# Patient Record
Sex: Male | Born: 1945 | Hispanic: Yes | Marital: Married | Smoking: Never smoker
Health system: Southern US, Community
[De-identification: ages and names within clinical notes are randomized; demographics above are authoritative.]

---

## 2011-03-07 ENCOUNTER — Other Ambulatory Visit (HOSPITAL_BASED_OUTPATIENT_CLINIC_OR_DEPARTMENT_OTHER): Payer: Self-pay | Admitting: Internal Medicine

## 2011-03-07 DIAGNOSIS — M549 Dorsalgia, unspecified: Secondary | ICD-10-CM

## 2011-03-08 ENCOUNTER — Ambulatory Visit (INDEPENDENT_AMBULATORY_CARE_PROVIDER_SITE_OTHER)
Admission: RE | Admit: 2011-03-08 | Discharge: 2011-03-08 | Disposition: A | Discharge status: Home / Self Care | Payer: Medicare Other | Source: Ambulatory Visit | Attending: Internal Medicine | Admitting: Internal Medicine

## 2011-03-08 ENCOUNTER — Ambulatory Visit (HOSPITAL_BASED_OUTPATIENT_CLINIC_OR_DEPARTMENT_OTHER)
Admission: RE | Admit: 2011-03-08 | Discharge: 2011-03-08 | Disposition: A | Discharge status: Home / Self Care | Payer: Medicare Other | Source: Ambulatory Visit | Attending: Internal Medicine | Admitting: Internal Medicine

## 2011-03-08 DIAGNOSIS — M549 Dorsalgia, unspecified: Secondary | ICD-10-CM

## 2011-03-08 DIAGNOSIS — G9389 Other specified disorders of brain: Secondary | ICD-10-CM | POA: Insufficient documentation

## 2011-03-08 MED ORDER — GADOBENATE DIMEGLUMINE 529 MG/ML IV SOLN
12.0000 mL | Freq: Once | INTRAVENOUS | Status: AC | PRN
  Administered 2011-03-08: 12 mL via INTRAVENOUS

## 2013-10-04 DEATH — deceased

## 2013-10-16 ENCOUNTER — Other Ambulatory Visit: Payer: Self-pay | Admitting: Orthopedic Surgery

## 2013-10-16 DIAGNOSIS — M545 Low back pain, unspecified: Secondary | ICD-10-CM

## 2013-10-20 ENCOUNTER — Ambulatory Visit
Admission: RE | Admit: 2013-10-20 | Discharge: 2013-10-20 | Disposition: A | Discharge status: Home / Self Care | Payer: Medicare Other | Source: Ambulatory Visit | Attending: Orthopedic Surgery | Admitting: Orthopedic Surgery

## 2013-10-20 DIAGNOSIS — M545 Low back pain: Secondary | ICD-10-CM

## 2014-02-10 ENCOUNTER — Other Ambulatory Visit: Payer: Self-pay | Admitting: Orthopaedic Surgery

## 2014-02-10 DIAGNOSIS — M549 Dorsalgia, unspecified: Secondary | ICD-10-CM

## 2014-02-12 ENCOUNTER — Ambulatory Visit
Admission: RE | Admit: 2014-02-12 | Discharge: 2014-02-12 | Disposition: A | Discharge status: Home / Self Care | Payer: Medicare Other | Source: Ambulatory Visit | Attending: Orthopaedic Surgery | Admitting: Orthopaedic Surgery

## 2014-02-12 VITALS — BP 123/74 | HR 64

## 2014-02-12 DIAGNOSIS — M549 Dorsalgia, unspecified: Secondary | ICD-10-CM

## 2014-02-12 MED ORDER — IOHEXOL 180 MG/ML  SOLN: 10.0000 mL | Freq: Once | INTRAMUSCULAR | Status: DC | PRN

## 2014-02-12 MED ORDER — IOHEXOL 300 MG/ML  SOLN
10.0000 mL | Freq: Once | INTRAMUSCULAR | Status: AC | PRN
  Administered 2014-02-12: 10 mL via INTRATHECAL

## 2014-02-12 MED ORDER — DIAZEPAM 5 MG PO TABS
5.0000 mg | ORAL_TABLET | Freq: Once | ORAL | Status: AC
  Administered 2014-02-12: 5 mg via ORAL

## 2014-02-12 NOTE — Progress Notes (Signed)
Pt states hasn't taken amitriptyline in some time. Discharge instructions explained to pt.

## 2014-02-12 NOTE — Discharge Instructions (Signed)
Myelogram Discharge Instructions  1. Go home and rest quietly for the next 24 hours.  It is important to lie flat for the next 24 hours.  Get up only to go to the restroom.  You may lie in the bed or on a couch on your back, your stomach, your left side or your right side.  You may have one pillow under your head.  You may have pillows between your knees while you are on your side or under your knees while you are on your back.  2. DO NOT drive today.  Recline the seat as far back as it will go, while still wearing your seat belt, on the way home.  3. You may get up to go to the bathroom as needed.  You may sit up for 10 minutes to eat.  You may resume your normal diet and medications unless otherwise indicated.  Drink lots of extra fluids today and tomorrow.  4. The incidence of headache, nausea, or vomiting is about 5% (one in 20 patients).  If you develop a headache, lie flat and drink plenty of fluids until the headache goes away.  Caffeinated beverages may be helpful.  If you develop severe nausea and vomiting or a headache that does not go away with flat bed rest, call [phone].  5. You may resume normal activities after your 24 hours of bed rest is over; however, do not exert yourself strongly or do any heavy lifting tomorrow. If when you get up you have a headache when standing, go back to bed and force fluids for another 24 hours.  6. Call your physician for a follow-up appointment.  The results of your myelogram will be sent directly to your physician by the following day.  7. If you have any questions or if complications develop after you arrive home, please call [phone].  Discharge instructions have been explained to the patient.  The patient, or the person responsible for the patient, fully understands these instructions.      May resume amitriptyline and tramadol on February 13, 2014, after 1:00 pm.

## 2015-11-09 IMAGING — CT CT T SPINE W/ CM
3 of 13 series · 8 of 33 positions shown, 9 images · non-contrast
Comparison: none

CLINICAL DATA: Postsurgical arthrodesis. Myelogram. Calf pain.
History of traumatic fracture of the thoracic spine status post
fixation.
TECHNIQUE: Contiguous axial images were obtained through the Thoracic and
Lumbar spine after the intrathecal infusion of infusion. Coronal and
sagittal reconstructions were obtained of the axial image sets.

[Series 2: l spine bone · axial · 0.29mm/px · z∈[-337,-172]mm · 2 of 200 slices shown, 3 images]
[im 67/200  soft-tissue]
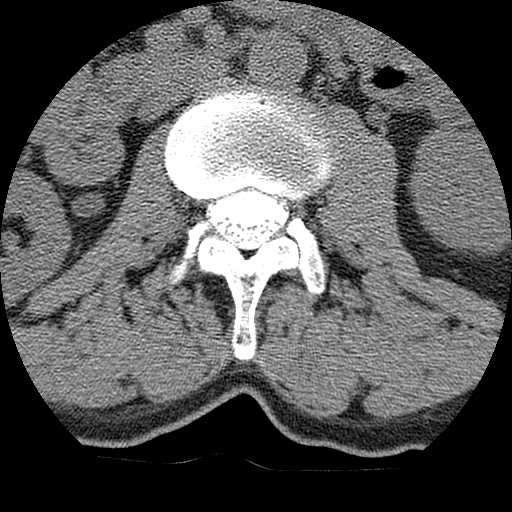
[im 67/200  bone]
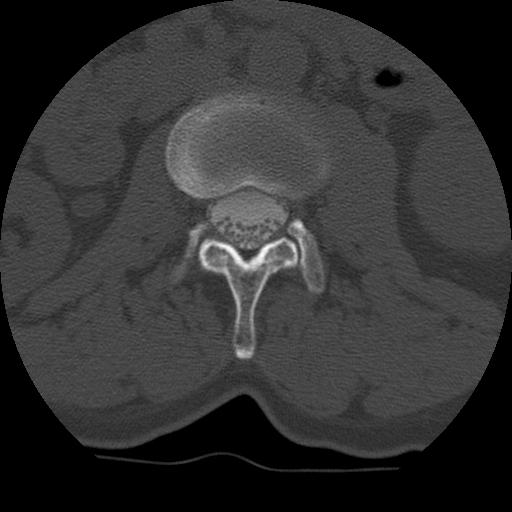
[im 133/200  bone]
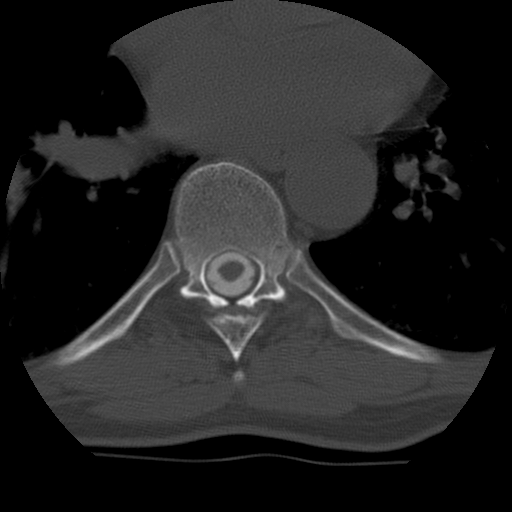

[Series 3: l spine soft · axial · 0.29mm/px · z∈[-337,-172]mm · 2 of 200 slices shown]
[im 67/200  soft-tissue]
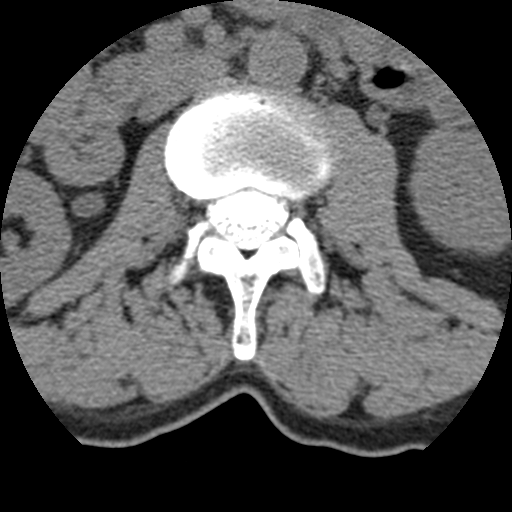
[im 133/200  soft-tissue]
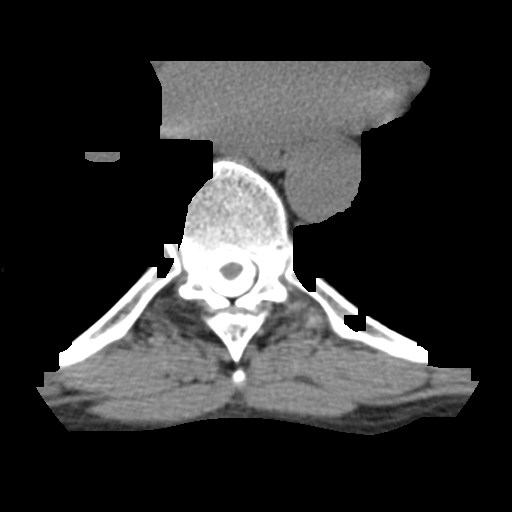

[Series 108: sag l spine · sagittal · 0.53mm/px · 4 of 53 slices shown]
[im 11/53  bone]
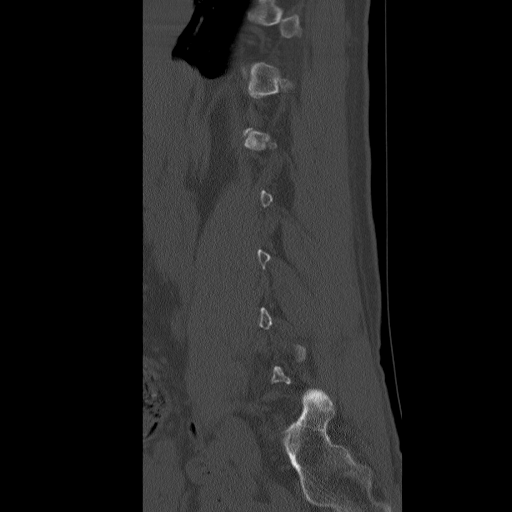
[im 21/53  bone]
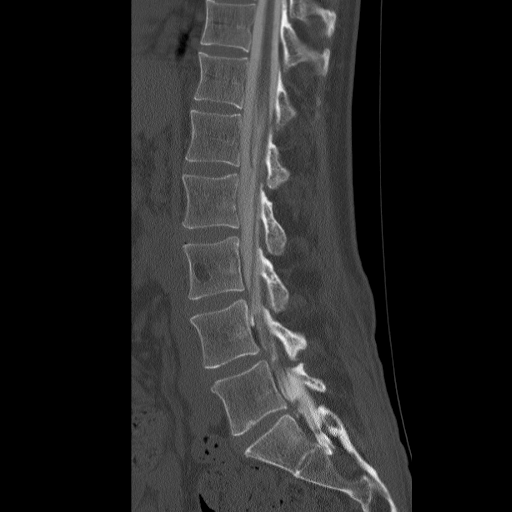
[im 32/53  bone]
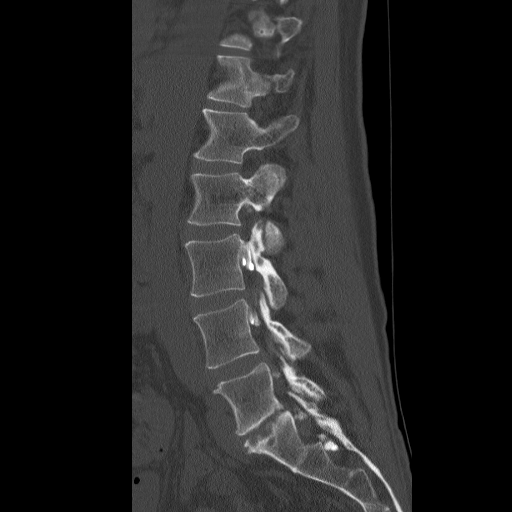
[im 42/53  bone]
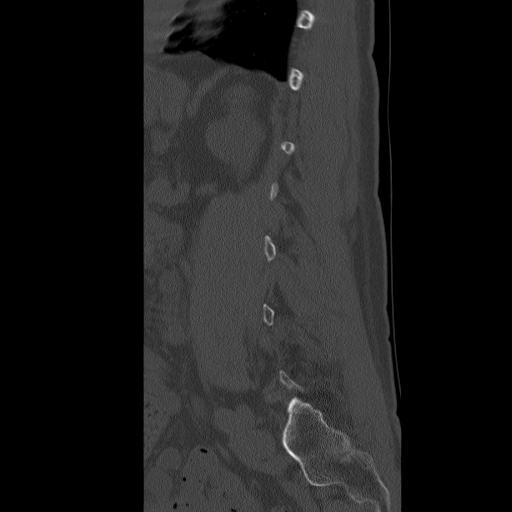

[8 of 33 positions shown; findings below may reference images not displayed]

FLUOROSCOPY TIME:  1 min 46 seconds

PROCEDURE:
LUMBAR PUNCTURE FOR THORACIC AND LUMBAR MYELOGRAM

After thorough discussion of risks and benefits of the procedure
including bleeding, infection, injury to nerves, blood vessels,
adjacent structures as well as headache and CSF leak, written and
oral informed consent was obtained. Consent was obtained by Dr.
Ivanir Uchoa.

Patient was positioned prone on the fluoroscopy table. Local
anesthesia was provided with 1% lidocaine without epinephrine after
prepped and draped in the usual sterile fashion. Puncture was
performed at L3-L4 using a 3 1/2 inch 22 gauge pencil point spinal
needle via right paramedian approach. Using a single pass through
the dura, the needle was placed within the thecal sac, with return
of clear CSF. 10 mL of [id number] was injected into the thecal
sac, with normal opacification of the nerve roots and cauda equina
consistent with free flow within the subarachnoid space. The patient
was then moved to the trendelenburg position and contrast flowed
into the Thoracic spine region.

I personally performed the lumbar puncture and administered the
intrathecal contrast. I also personally supervised acquisition of
the myelogram images.
FINDINGS: THORACIC AND LUMBAR MYELOGRAM FINDINGS:

Thoracolumbar transitional anatomy is present with L1 ribs
bilaterally. There are 12 rib-bearing thoracic vertebral bodies. The
thoracic fixation hardware may be used as a reference. No thoracic
stenosis is identified by plain film myelography. Rod and screw
fixation extends from T2 through T6. Old T4 compression fracture is
present. No other compression fractures are identified. There is no
hardware complication identified radiographically.

Lumbar spinal alignment is anatomic in the standing upright neutral
position on the frontal view. Right dynamic hip screw incidentally
noted. In the neutral upright position, alignment is anatomic. 2 mm
of anterolisthesis of L4 on L5 develops with flexion. There is no
spondylolisthesis with extension. There appears to be at L4-L5 disc
protrusion which is eccentric to the left, narrowing the left
lateral recess and potentially affecting the left L5 nerve.

CT THORACIC MYELOGRAM FINDINGS:

There is a mild concave contour of the superior T1 endplate, without
significant loss of vertebral body height. This probably represents
an old compression fracture. The T4 vertebra shows a chronic
compression fracture which is now healed. There is ossification of
the T3-T4 disc space. Rod and pedicle screw fixation is present with
screws at T2, T3, at T5 and T6 on the right and at the same levels
excepting T5 on the left. Laminectomy has been performed at T4. The
thoracic cord demonstrates normal caliber. No swelling. No cord
compression. There is bilateral T3-T4 foraminal stenosis associated
with residual posterior elements of T4 and the compression fracture.
No other levels demonstrate stenosis. Incidental visualization of
the lungs shows bilateral apical scarring which may be due to old
granulomatous disease. This appears symmetric. Partially visualized
deformity of the left chest wall from prior trauma.

Incidental imaging of abdominal contents shows nonobstructing left
renal collecting system calculi. Aortoiliac atherosclerosis. Minimal
SI joint degenerative disease. The spinal cord terminates posterior
to the L1 vertebra.

Regarding the postsurgical changes, there appears to be ankylosis of
the T5-T6 facet joints and T2-T3 facet joints bilaterally. Solid
posterior lateral fusion mass is present on the right bridging T3
and T5. No definite bridging fusion mass on the left.

At T3-T4, there is deformity of the thoracic cord, with angulation
of the cord, which probably represents a combination of thoracic
cord myelomalacia and posttraumatic adhesions/scar tissue.

CT LUMBAR MYELOGRAM FINDINGS:

Benign hemangioma is present at L3. Vertebral body height in the
lumbar spine is within normal limits. The paraspinal soft tissues
are described above. There is no spondylolisthesis in the supine
position for CT.

T12-L1:  Negative.

L1-L2:  Negative.

L2-L3:  Negative.

L3-L4:  Negative.

L4-L5: Right facet arthrosis is moderate to severe. The central
canal appears patent. The disc is mildly degenerated with
circumferential disc bulging. Nerve root sleeves fill normally. No
lateral recess stenosis is identified. The foramina appear
adequately patent.

L5-S1: Moderate left and mild right facet arthrosis. Disc
degeneration is present which is asymmetrically worse on the left.
Degenerative endplate changes. There is mild left foraminal stenosis
associated with collapse of the disc space. Lateral recesses appear
patent. Central canal patent.
IMPRESSION: 1. Technically successful lumbar puncture for myelogram using pencil
point needle at L3-L4.
2. Thoracolumbar transitional anatomy is present. There are 13 sets
of ribs with small ribs at L1. Correlation with imaging studies
recommended prior to any intervention.
3. Posterior rod and screw fixation from T2 through T6 with solid
posterior lateral fusion mass on the right. Hardware bridges T4
compression fracture which is now healed, with ossification and
ankylosis of the T3-T4 disc space. Residual T3-T4 bony foraminal
stenosis bilaterally.
4. Cord deformity dorsal the T3 is consistent with a combination of
myelomalacia and adhesions associated with prior cord trauma.
5. L4-L5 right facet arthrosis with shallow circumferential disc
bulge. On supine CT, no significant stenosis is identified. Plain
film myelogram shows mild encroachment on the descending right L5
nerve in the lateral recess, probably due to bulging disc in the
standing upright position.
6. L5-S1 asymmetric left-sided disc degeneration with mild left
foraminal stenosis.

## 2017-03-29 ENCOUNTER — Other Ambulatory Visit: Payer: Self-pay | Admitting: Pain Medicine

## 2017-03-29 DIAGNOSIS — M4714 Other spondylosis with myelopathy, thoracic region: Secondary | ICD-10-CM

## 2020-01-19 ENCOUNTER — Ambulatory Visit: Payer: Medicare Other | Attending: Internal Medicine

## 2022-08-23 ENCOUNTER — Ambulatory Visit: Payer: Medicare HMO | Admitting: Neurology

## 2022-08-23 ENCOUNTER — Encounter: Payer: Self-pay | Admitting: Neurology

## 2022-08-23 VITALS — BP 124/79 | HR 59 | Ht 67.0 in | Wt 138.5 lb

## 2022-08-23 DIAGNOSIS — R269 Unspecified abnormalities of gait and mobility: Secondary | ICD-10-CM

## 2022-08-23 DIAGNOSIS — G3184 Mild cognitive impairment, so stated: Secondary | ICD-10-CM

## 2022-08-23 NOTE — Patient Instructions (Signed)
MRI Brain without contrast  TSH and Vitamin B 12  Referral to neuropsychiatry  Follow up in a year    There are well-accepted and sensible ways to reduce risk for Alzheimers disease and other degenerative brain disorders .  Exercise Daily Walk A daily 20 minute walk should be part of your routine. Disease related apathy can be a significant roadblock to exercise and the only way to overcome this is to make it a daily routine and perhaps have a reward at the end (something your loved one loves to eat or drink perhaps) or a personal trainer coming to the home can also be very useful. Most importantly, the patient is much more likely to exercise if the caregiver / spouse does it with him/her. In general a structured, repetitive schedule is best.  General Health: Any diseases which effect your body will effect your brain such as a pneumonia, urinary infection, blood clot, heart attack or stroke. Keep contact with your primary care doctor for regular follow ups.  Sleep. A good nights sleep is healthy for the brain. Seven hours is recommended. If you have insomnia or poor sleep habits we can give you some instructions. If you have sleep apnea wear your mask.  Diet: Eating a heart healthy diet is also a good idea; fish and poultry instead of red meat, nuts (mostly non-peanuts), vegetables, fruits, olive oil or canola oil (instead of butter), minimal salt (use other spices to flavor foods), whole grain rice, bread, cereal and pasta and wine in moderation.Research is now showing that the MIND diet, which is a combination of The Mediterranean diet and the DASH diet, is beneficial for cognitive processing and longevity. Information about this diet can be found in The MIND Diet, a book by Doyne Keel, MS, RDN, and online at [employer].si  Finances, Power of Attorney and Advance Directives: You should consider putting legal safeguards in place with regard to financial and medical  decision making. While the spouse always has power of attorney for medical and financial issues in the absence of any form, you should consider what you want in case the spouse / caregiver is no longer around or capable of making decisions.
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
## 2022-08-24 ENCOUNTER — Telehealth: Payer: Self-pay

## 2022-08-24 ENCOUNTER — Telehealth: Payer: Self-pay | Admitting: Neurology

## 2022-08-24 LAB — VITAMIN B12: Vitamin B-12: 794 pg/mL (ref 232–1245)

## 2022-08-24 LAB — TSH: TSH: 3.99 u[IU]/mL (ref 0.450–4.500)

## 2022-08-24 NOTE — Telephone Encounter (Signed)
I spoke with the patient and provided normal results. He verbalized understanding and expressed appreciation for the call.

## 2022-08-24 NOTE — Telephone Encounter (Signed)
[employer] medicare sent to [facility] they obtain auth  

## 2022-08-24 NOTE — Progress Notes (Signed)
Please call and advise the patient that the recent TSH and Vitamin B12 level we checked were within normal limits. No further action is required on these tests at this time. Please remind patient to keep any upcoming appointments or tests and to call us with any interim questions, concerns, problems or updates. Thanks,   Jaquasia Doscher, MD    

## 2022-08-24 NOTE — Telephone Encounter (Signed)
-----   Message from Alric Ran, MD sent at 08/24/2022  8:16 AM EDT ----- Please call and advise the patient that the recent TSH and Vitamin B12 level we checked were within normal limits. No further action is required on these tests at this time. Please remind patient to keep any upcoming appointments or tests and to call us with any interim questions, concerns, problems or updates. Thanks,   Alric Ran, MD

## 2022-08-29 ENCOUNTER — Telehealth: Payer: Self-pay | Admitting: Neurology

## 2022-08-29 NOTE — Telephone Encounter (Signed)
Psych referral faxed to [facility], phone # [phone].

## 2022-09-11 ENCOUNTER — Other Ambulatory Visit: Payer: Medicare Other

## 2023-05-06 ENCOUNTER — Other Ambulatory Visit: Payer: Self-pay

## 2023-05-06 ENCOUNTER — Encounter (HOSPITAL_BASED_OUTPATIENT_CLINIC_OR_DEPARTMENT_OTHER): Payer: Self-pay | Admitting: Emergency Medicine

## 2023-05-06 ENCOUNTER — Emergency Department (HOSPITAL_BASED_OUTPATIENT_CLINIC_OR_DEPARTMENT_OTHER)
Admission: EM | Admit: 2023-05-06 | Discharge: 2023-05-07 | Disposition: A | Discharge status: Home / Self Care | Payer: Medicare Other | Attending: Emergency Medicine | Admitting: Emergency Medicine

## 2023-05-06 DIAGNOSIS — I4891 Unspecified atrial fibrillation: Secondary | ICD-10-CM | POA: Insufficient documentation

## 2023-05-06 DIAGNOSIS — R21 Rash and other nonspecific skin eruption: Secondary | ICD-10-CM | POA: Diagnosis present

## 2023-05-06 DIAGNOSIS — L259 Unspecified contact dermatitis, unspecified cause: Secondary | ICD-10-CM | POA: Diagnosis not present

## 2023-05-06 DIAGNOSIS — Z7901 Long term (current) use of anticoagulants: Secondary | ICD-10-CM | POA: Insufficient documentation

## 2023-05-06 LAB — COMPREHENSIVE METABOLIC PANEL
ALT: 20 U/L (ref 0–44)
AST: 28 U/L (ref 15–41)
Albumin: 3.5 g/dL (ref 3.5–5.0)
Alkaline Phosphatase: 40 U/L (ref 38–126)
Anion gap: 6 (ref 5–15)
BUN: 32 mg/dL — ABNORMAL HIGH (ref 8–23)
CO2: 26 mmol/L (ref 22–32)
Calcium: 8.8 mg/dL — ABNORMAL LOW (ref 8.9–10.3)
Chloride: 107 mmol/L (ref 98–111)
Creatinine, Ser: 1.32 mg/dL — ABNORMAL HIGH (ref 0.61–1.24)
GFR, Estimated: 56 mL/min — ABNORMAL LOW (ref >60)
Glucose, Bld: 115 mg/dL — ABNORMAL HIGH (ref 70–99)
Potassium: 4.6 mmol/L (ref 3.5–5.1)
Sodium: 139 mmol/L (ref 135–145)
Total Bilirubin: 0.6 mg/dL (ref 0.3–1.2)
Total Protein: 6.5 g/dL (ref 6.5–8.1)

## 2023-05-06 LAB — CBC WITH DIFFERENTIAL/PLATELET
Abs Immature Granulocytes: 0 10*3/uL (ref 0.00–0.07)
Basophils Absolute: 0 10*3/uL (ref 0.0–0.1)
Basophils Relative: 1 %
Eosinophils Absolute: 0.1 10*3/uL (ref 0.0–0.5)
Eosinophils Relative: 3 %
HCT: 33.3 % — ABNORMAL LOW (ref 39.0–52.0)
Hemoglobin: 10.8 g/dL — ABNORMAL LOW (ref 13.0–17.0)
Immature Granulocytes: 0 %
Lymphocytes Relative: 36 %
Lymphs Abs: 1.6 10*3/uL (ref 0.7–4.0)
MCH: 29.4 pg (ref 26.0–34.0)
MCHC: 32.4 g/dL (ref 30.0–36.0)
MCV: 90.7 fL (ref 80.0–100.0)
Monocytes Absolute: 0.4 10*3/uL (ref 0.1–1.0)
Monocytes Relative: 9 %
Neutro Abs: 2.2 10*3/uL (ref 1.7–7.7)
Neutrophils Relative %: 51 %
Platelets: 169 10*3/uL (ref 150–400)
RBC: 3.67 MIL/uL — ABNORMAL LOW (ref 4.22–5.81)
RDW: 12.9 % (ref 11.5–15.5)
WBC: 4.3 10*3/uL (ref 4.0–10.5)
nRBC: 0 % (ref 0.0–0.2)

## 2023-05-06 NOTE — ED Provider Notes (Signed)
[facility] Provider Note   CSN: [id number] Arrival date & time: 05/06/23  2128     History  Chief Complaint  Patient presents with   Rash    Paul Weeks is a 77 y.o. male.  Patient nontoxic no acute distress.  Claims that was watching TV this afternoon noticed that he had some blisters on his left lateral thigh.  And some redness.  There denied any significant pain but there was some mild pain denied any itching.  Is never anything like this happen before.  Denies any systemic symptoms no fever shortness of breath difficulty swallowing headache abdominal pain chest pain patient dates he said he felt fine all day.  Does work out in the yard a lot sometimes does lay on that hip area.  Is never anything like this occur before no new medications.  Patient's primary care doctors in the [facility] area.  Past medical history noncontributory however patient is on Eliquis patient is on Elavil.  Chart review shows that he is on Eliquis probably for atrial fibrillation.  He also has a history of senile purpura.  And a history of chronic pain.  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       Home Medications Prior to Admission medications   Medication Sig Start Date End Date Taking? Authorizing Provider  amitriptyline (ELAVIL) 100 MG tablet Take 1 tablet by mouth 2 (two) times daily. 11/30/21   [provider]  apixaban (ELIQUIS) 5 MG TABS tablet Take 5 mg by mouth 2 (two) times daily. 07/31/17   [provider]  UNABLE TO FIND Take 1 capsule by mouth as needed. Med Name: Sedoxil    [provider]      Allergies    Sulfa antibiotics    Review of Systems   Review of Systems  Constitutional:  Negative for chills and fever.  HENT:  Negative for ear pain and sore throat.   Eyes:  Negative for pain and visual disturbance.  Respiratory:  Negative for cough and shortness of breath.   Cardiovascular:   Negative for chest pain and palpitations.  Gastrointestinal:  Negative for abdominal pain and vomiting.  Genitourinary:  Negative for dysuria and hematuria.  Musculoskeletal:  Negative for arthralgias and back pain.  Skin:  Positive for rash. Negative for color change.  Neurological:  Negative for seizures and syncope.  All other systems reviewed and are negative.   Physical Exam Updated Vital Signs BP 139/71 (BP Location: Right Arm)   Pulse 62   Temp 98 F (36.7 C) (Oral)   Resp 18   SpO2 96%  Physical Exam Vitals and nursing note reviewed.  Constitutional:      General: He is not in acute distress.    Appearance: He is well-developed.  HENT:     Head: Normocephalic and atraumatic.  Eyes:     Extraocular Movements: Extraocular movements intact.     Conjunctiva/sclera: Conjunctivae normal.     Pupils: Pupils are equal, round, and reactive to light.  Cardiovascular:     Rate and Rhythm: Normal rate and regular rhythm.     Heart sounds: No murmur heard. Pulmonary:     Effort: Pulmonary effort is normal. No respiratory distress.     Breath sounds: Normal breath sounds.  Abdominal:     Palpations: Abdomen is soft.     Tenderness: There is no abdominal tenderness.  Musculoskeletal:  General: No swelling.     Cervical back: Normal range of motion and neck supple.  Skin:    General: Skin is warm and dry.     Capillary Refill: Capillary refill takes less than 2 seconds.     Findings: Erythema and rash present.     Comments: Left lateral thigh with a spherical area of some erythema some blistering with bullae clear fluid discharge highly suggestive of may be a burn patient denies anything that could cause a burn.  It is very localized.  Not bleeding.  Not consistent with infection.  Rest of his skin is completely normal.  No peeling of skin anywhere else.  No peeling with pressure of the thumb anywhere else.  Neurological:     General: No focal deficit present.     Mental  Status: He is alert and oriented to person, place, and time.     Cranial Nerves: No cranial nerve deficit.     Sensory: No sensory deficit.     Motor: No weakness.  Psychiatric:        Mood and Affect: Mood normal.     ED Results / Procedures / Treatments   Labs (all labs ordered are listed, but only abnormal results are displayed) Labs Reviewed  CBC WITH DIFFERENTIAL/PLATELET - Abnormal; Notable for the following components:      Result Value   RBC 3.67 (*)    Hemoglobin 10.8 (*)    HCT 33.3 (*)    All other components within normal limits  COMPREHENSIVE METABOLIC PANEL - Abnormal; Notable for the following components:   Glucose, Bld 115 (*)    BUN 32 (*)    Creatinine, Ser 1.32 (*)    Calcium 8.8 (*)    GFR, Estimated 56 (*)    All other components within normal limits    EKG None  Radiology No results found.  Procedures Procedures    Medications Ordered in ED Medications - No data to display  ED Course/ Medical Decision Making/ A&P                             Medical Decision Making Amount and/or Complexity of Data Reviewed Labs: ordered.   Patient nontoxic no acute distress.  Labs very reassuring white count 4.3 hemoglobin to 10.8 so down a little bit platelets are normal at 169.  Complete metabolic panel significant for some renal insufficiency but he is known to have stage IIIb's GFR is 56 here.  LFTs are normal electrolytes are normal calcium may be down a little bit at 8.8.  Patient's vital signs here are very reassuring.  No fever temp 98 heart rate 62 respirations 18 blood pressure 139/71 oxygen saturations 96% on room air and patient appears very nontoxic.  Here we applied antibiotic ointment and dressing.  Patient has a primary care doctor locally which she can follow-up with this week.  Recommending close follow-up may need referral to dermatology precautions provided that if he starts to get sick at all he is to get seen right away.  This does not  seem to be consistent with TTN.  There is no systemic symptoms associated with this.  Seems to be a local thing that could be a local dermatitis still possibility that it could be a local burn.  No new medications to explain any kind of reaction but the certainly is not a systemic type reaction. Final Clinical Impression(s) / ED Diagnoses Final diagnoses:  Contact dermatitis, unspecified contact dermatitis type, unspecified trigger    Rx / DC Orders ED Discharge Orders     None         Vanetta Mulders, MD 05/07/23 0001

## 2023-05-06 NOTE — ED Triage Notes (Signed)
Pt here for red/purple rash w/ blisters to L upper lateral thigh. Pt reports he was watching TV this afternoon and felt a pain on his L leg. He later went to the bathroom and noticed rash with blisters, some had popped. Pt reports pain, no itching.

## 2023-05-06 NOTE — Discharge Instructions (Signed)
Not sure exactly what is causing the rash but close follow-up with primary care doctor important.  Continue to apply antibiotic ointment each day after washing with soap and water.  Labs here without any significant abnormalities other than some elevation in your renal function.  If you start to feel very sick very important that you get seen would probably require admission.  Your primary care doctor may want to refer you to a dermatologist.  Hopefully you will not get sick with this.  And this is either an allergic reaction does some exposure.  It would be a local exposure

## 2023-05-07 NOTE — ED Notes (Signed)
RN reviewed discharge instructions w/ pt. Follow up and wound care reviewed, pt had no further questions 

## 2023-08-29 ENCOUNTER — Ambulatory Visit: Payer: Medicare HMO | Admitting: Neurology
